# Patient Record
Sex: Male | Born: 1990 | Race: Black or African American | Hispanic: No | Marital: Single | Smoking: Never smoker
Health system: Southern US, Community
[De-identification: ages and names within clinical notes are randomized; demographics above are authoritative.]

## PROBLEM LIST (undated history)

## (undated) DIAGNOSIS — R569 Unspecified convulsions: Secondary | ICD-10-CM

## (undated) DIAGNOSIS — J45909 Unspecified asthma, uncomplicated: Secondary | ICD-10-CM

---

## 2022-01-03 ENCOUNTER — Emergency Department (HOSPITAL_COMMUNITY): Payer: Self-pay

## 2022-01-03 ENCOUNTER — Other Ambulatory Visit: Payer: Self-pay

## 2022-01-03 ENCOUNTER — Encounter (HOSPITAL_COMMUNITY): Payer: Self-pay

## 2022-01-03 ENCOUNTER — Emergency Department (HOSPITAL_COMMUNITY)
Admission: EM | Admit: 2022-01-03 | Discharge: 2022-01-03 | Disposition: A | Discharge status: Home / Self Care | Payer: Self-pay | Attending: Emergency Medicine | Admitting: Emergency Medicine

## 2022-01-03 DIAGNOSIS — E876 Hypokalemia: Secondary | ICD-10-CM | POA: Insufficient documentation

## 2022-01-03 DIAGNOSIS — R569 Unspecified convulsions: Secondary | ICD-10-CM | POA: Insufficient documentation

## 2022-01-03 DIAGNOSIS — R519 Headache, unspecified: Secondary | ICD-10-CM | POA: Insufficient documentation

## 2022-01-03 HISTORY — DX: Unspecified convulsions: R56.9

## 2022-01-03 HISTORY — DX: Unspecified asthma, uncomplicated: J45.909

## 2022-01-03 LAB — BASIC METABOLIC PANEL
Anion gap: 7 (ref 5–15)
BUN: 7 mg/dL (ref 6–20)
CO2: 21 mmol/L — ABNORMAL LOW (ref 22–32)
Calcium: 8.5 mg/dL — ABNORMAL LOW (ref 8.9–10.3)
Chloride: 108 mmol/L (ref 98–111)
Creatinine, Ser: 0.8 mg/dL (ref 0.61–1.24)
GFR, Estimated: >60 mL/min (ref >60)
Glucose, Bld: 85 mg/dL (ref 70–99)
Potassium: 3.3 mmol/L — ABNORMAL LOW (ref 3.5–5.1)
Sodium: 136 mmol/L (ref 135–145)

## 2022-01-03 LAB — CBC
HCT: 46.7 % (ref 39.0–52.0)
Hemoglobin: 16.4 g/dL (ref 13.0–17.0)
MCH: 31.2 pg (ref 26.0–34.0)
MCHC: 35.1 g/dL (ref 30.0–36.0)
MCV: 88.8 fL (ref 80.0–100.0)
Platelets: 268 10*3/uL (ref 150–400)
RBC: 5.26 MIL/uL (ref 4.22–5.81)
RDW: 12.6 % (ref 11.5–15.5)
WBC: 6.9 10*3/uL (ref 4.0–10.5)
nRBC: 0 % (ref 0.0–0.2)

## 2022-01-03 LAB — RAPID URINE DRUG SCREEN, HOSP PERFORMED
Amphetamines: POSITIVE — AB
Barbiturates: NOT DETECTED
Benzodiazepines: POSITIVE — AB
Cocaine: NOT DETECTED
Opiates: NOT DETECTED
Tetrahydrocannabinol: POSITIVE — AB

## 2022-01-03 LAB — ETHANOL: Alcohol, Ethyl (B): <10 mg/dL (ref <10)

## 2022-01-03 MED ORDER — LEVETIRACETAM 500 MG PO TABS: 500.0000 mg | ORAL_TABLET | Freq: Two times a day (BID) | ORAL | 0 refills | Status: AC

## 2022-01-03 MED ORDER — LEVETIRACETAM IN NACL 1500 MG/100ML IV SOLN
1500.0000 mg | Freq: Once | INTRAVENOUS | Status: AC
  Administered 2022-01-03: 1500 mg via INTRAVENOUS
  Filled 2022-01-03: qty 100

## 2022-01-03 NOTE — ED Triage Notes (Signed)
Pt BIB EMS from work for a seizure. Per EMS pt co-worker described the seizure lasting 1 minute (Atonic clonic). Per EMS pt  was "post-ictal" upon arrival. Pt has had hx of seizures. Febrile seizure when he was a kid and this is his third seizure within the last 6 months. No treatments/assessments has been given and he is not on any medications for seizure activity. Pt presents w/ bruising on the left side of his head and left shoulder. ? ?121/54 bp ?116 hr ?16 rr ?98% room air ?79 cbg ? ?20g left forearm ?

## 2022-01-03 NOTE — ED Provider Notes (Signed)
?[facility]-EMERGENCY DEPT ?Provider Note ? ? ?CSN: [id number] ?Arrival date & time: 01/03/22  1143 ? ?  ? ?History ? ?Chief Complaint  ?Patient presents with  ? Seizures  ? ? ?Mitchell Wolf is a 31 y.o. male. ? ?HPI ?31 year old male history of seizure disorder as a child presents today with episode of altered mental status.  This was unwitnessed.  He was found on the ground.  He was confused afterwards.  He states he has had 2 prior episodes of this in the past 6 months.  1 episode was witnessed with a question of some shaking.  The other episode he was found down and was confused afterwards.  He states he hurt his head and 1 of these episodes.  He went to the hospital was not evaluated due to a long wait.  States he has not been evaluated in the past 6 months.  He denies any lateralized symptoms, recent head injuries besides the fall, changes in dietary or other intake.  He not regularly drink alcohol.  He does smoke marijuana.  He endorses regular vaping but denies any other illicit drug use.  He has some headache today.  He denies loss of bowel or bladder control during these episodes ? ?  ? ?Home Medications ?Prior to Admission medications   ?Medication Sig Start Date End Date Taking? Authorizing Provider  ?levETIRAcetam (KEPPRA) 500 MG tablet Take 1 tablet (500 mg total) by mouth 2 (two) times daily. 01/03/22  Yes Margarita Grizzle, MD  ?   ? ?Allergies    ?Patient has no allergy information on record.   ? ?Review of Systems   ?Review of Systems  ?All other systems reviewed and are negative. ? ?Physical Exam ?Updated Vital Signs ?BP (!) 132/91   Pulse 94   Temp 99.3 ?F (37.4 ?C) (Oral)   Resp 18   Ht 1.854 m (6\' 1" )   Wt 104.3 kg   SpO2 98%   BMI 30.34 kg/m?  ?Physical Exam ?Vitals and nursing note reviewed.  ?Constitutional:   ?   Appearance: Normal appearance.  ?HENT:  ?   Head: Normocephalic.  ?   Right Ear: External ear normal.  ?   Left Ear: External ear normal.  ?   Nose: Nose normal.   ?   Mouth/Throat:  ?   Pharynx: Oropharynx is clear.  ?Eyes:  ?   Pupils: Pupils are equal, round, and reactive to light.  ?Cardiovascular:  ?   Rate and Rhythm: Normal rate and regular rhythm.  ?   Pulses: Normal pulses.  ?Pulmonary:  ?   Effort: Pulmonary effort is normal.  ?   Breath sounds: Normal breath sounds.  ?Abdominal:  ?   General: Abdomen is flat. Bowel sounds are normal.  ?   Palpations: Abdomen is soft.  ?Musculoskeletal:     ?   General: Normal range of motion.  ?   Cervical back: Normal range of motion.  ?Skin: ?   General: Skin is warm.  ?   Capillary Refill: Capillary refill takes less than 2 seconds.  ?Neurological:  ?   Mental Status: He is alert and oriented to person, place, and time.  ?   Cranial Nerves: No cranial nerve deficit.  ?   Sensory: No sensory deficit.  ?   Coordination: Coordination normal.  ?   Deep Tendon Reflexes: Reflexes normal.  ?Psychiatric:     ?   Mood and Affect: Mood normal.  ? ? ?ED Results /  Procedures / Treatments   ?Labs ?(all labs ordered are listed, but only abnormal results are displayed) ?Labs Reviewed  ?BASIC METABOLIC PANEL - Abnormal; Notable for the following components:  ?    Result Value  ? Potassium 3.3 (*)   ? CO2 21 (*)   ? Calcium 8.5 (*)   ? All other components within normal limits  ?RAPID URINE DRUG SCREEN, HOSP PERFORMED - Abnormal; Notable for the following components:  ? Benzodiazepines POSITIVE (*)   ? Amphetamines POSITIVE (*)   ? Tetrahydrocannabinol POSITIVE (*)   ? All other components within normal limits  ?CBC  ?ETHANOL  ? ? ?EKG ?None ? ?Radiology ?CT Head Wo Contrast ? ?Result Date: 01/03/2022 ?CLINICAL DATA:  Seizure. EXAM: CT HEAD WITHOUT CONTRAST TECHNIQUE: Contiguous axial images were obtained from the base of the skull through the vertex without intravenous contrast. RADIATION DOSE REDUCTION: This exam was performed according to the departmental dose-optimization program which includes automated exposure control, adjustment of the mA  and/or kV according to patient size and/or use of iterative reconstruction technique. COMPARISON:  None. FINDINGS: Brain: No evidence of acute infarction, hemorrhage, hydrocephalus, extra-axial collection or mass lesion/mass effect. Vascular: No hyperdense vessel or unexpected calcification. Skull: Normal. Negative for fracture or focal lesion. Sinuses/Orbits: No acute finding. Other: None. IMPRESSION: 1. Normal brain Electronically Signed   By: Signa Kellaylor  Stroud M.D.   On: 01/03/2022 12:31   ? ?Procedures ?Procedures  ? ? ?Medications Ordered in ED ?Medications  ?levETIRAcetam (KEPPRA) IVPB 1500 mg/ 100 mL premix (0 mg Intravenous Stopped 01/03/22 1302)  ? ? ?ED Course/ Medical Decision Making/ A&P ?Clinical Course as of 01/04/22 1719  ?Tue Jan 03, 2022  ?1317 Complete metabolic panel reviewed interpreted with mild hypokalemia and CO2 decreased to 21 consistent with seizure ?We will orally replete potassium [DR]  ?1317 CT of head reviewed interpreted and no acute abnormalities noted radiologist interpretation notes normal brain [DR]  ?1421 Urine drug screen positive for amphetamines, THC, and benzodiazepines.   [DR]  ?  ?Clinical Course User Index ?[DR] Margarita Grizzleay, Letti Towell, MD  ? ?                        ?Medical Decision Making ?31 year old male presents today with report of altered mental status and appears to have had seizure with postictal episode.  He reports 2 prior episodes in the last 6 months for total of 3 episodes.  1 of these was witnessed with tonic-clonic activity.  Each episode had a postictal component.  He is currently back to baseline.  Here is hemodynamically stable.  Urine is positive for THC, amphetamines, and benzos.  He was mildly hypokalemic and this is repleted.  Otherwise labs are normal. ?Patient given Keppra in ED ?Patient Discussed seizure precautions.  He does not drive. ?He is given referral to neurology and prescription for Keppra.  He appears stable for discharge ? ?Amount and/or Complexity  of Data Reviewed ?Independent Historian: EMS ?   Details: EMS note reviewed ?Labs: ordered. Decision-making details documented in ED Course. ?Radiology: ordered and independent interpretation performed. Decision-making details documented in ED Course. ? ?Risk ?Prescription drug management. ? ? ? ? ? ? ? ? ? ? ?Final Clinical Impression(s) / ED Diagnoses ?Final diagnoses:  ?Seizure (HCC)  ? ? ?Rx / DC Orders ?ED Discharge Orders   ? ?      Ordered  ?  levETIRAcetam (KEPPRA) 500 MG tablet  2 times daily       ?  01/03/22 1426  ?  Ambulatory referral to Neurology       ?Comments: An appointment is requested in approximately: 1 week  ? 01/03/22 1426  ? ?  ?  ? ?  ? ? ?  ?Margarita Grizzle, MD ?01/04/22 1719 ? ?

## 2022-01-03 NOTE — ED Notes (Signed)
Pt states understanding of dc instructions, importance of follow up. Pt denies questions or concerns and declined transportation assistance upon dc. Pt ambulated w/ a steady gait w/o need for assistance. No belongings left in room upon dc. ? ?

## 2022-01-03 NOTE — Discharge Instructions (Signed)
Per [facility] statutes, patients with seizures are not allowed to drive until they have been seizure-free for six months. Use caution when using heavy equipment or power tools. Avoid working on ladders or at heights. Take showers instead of baths. Ensure the water temperature is not too high on the home water heater. Do not go swimming alone. Do not lock yourself in a room alone (i.e. bathroom). When caring for infants or small children, sit down when holding, feeding, or changing them to minimize risk of injury to the child in the event you have a seizure. Maintain good sleep hygiene. Avoid alcohol.  ? ? ?If Mitchell Wolf has another seizure, call 911 and bring them back to the ED if: ?      A.  The seizure lasts longer than 5 minutes.      ?      B.  The patient doesn't wake shortly after the seizure or has new problems such as difficulty seeing, speaking or moving following the seizure ?      C.  The patient was injured during the seizure ?      D.  The patient has a temperature over 102 F (39C) ?      E.  The patient vomited during the seizure and now is having trouble breathing ? ?

## 2022-01-17 ENCOUNTER — Encounter: Payer: Self-pay | Admitting: Neurology

## 2022-01-17 ENCOUNTER — Ambulatory Visit: Payer: Self-pay | Admitting: Neurology

## 2022-02-06 DEATH — deceased

## 2023-04-22 IMAGING — CT CT HEAD W/O CM
3 series · 15 of 47 positions shown, 18 images · non-contrast
Comparison: None.

CLINICAL DATA: Seizure.



[Series 2: head wo · axial · 0.46mm/px · z∈[-78,+62]mm · 9 of 34 slices shown, 12 images]
[im 3/34  brain]
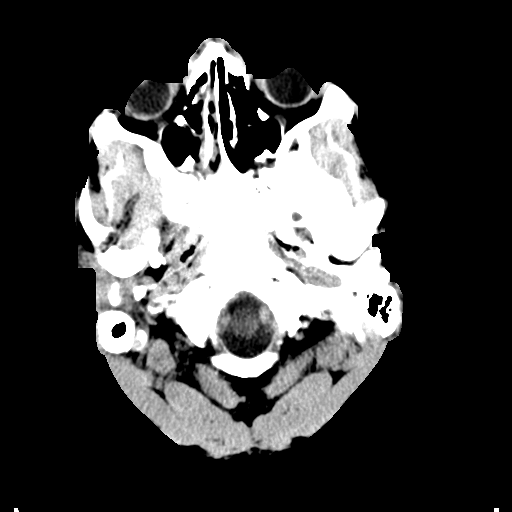
[im 3/34  bone]
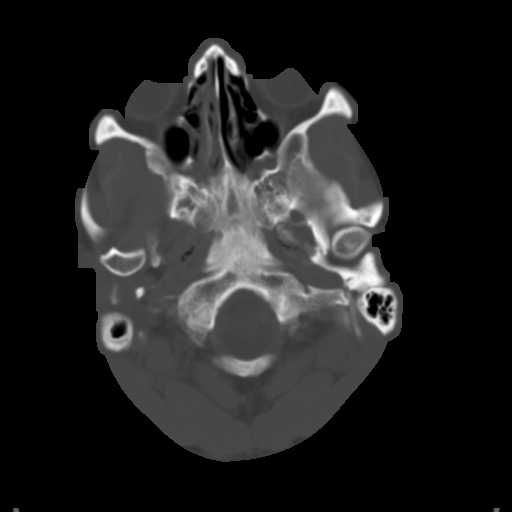
[im 6/34  brain]
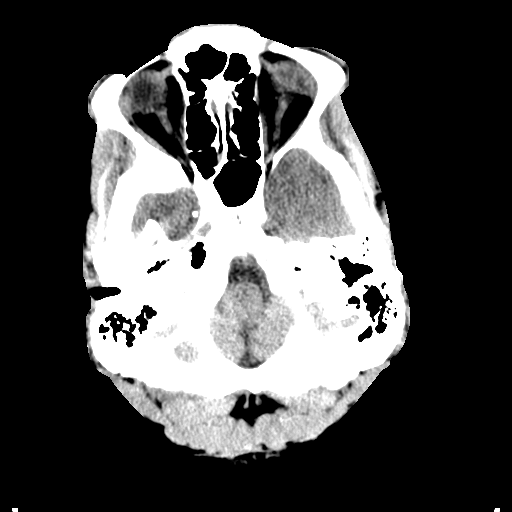
[im 10/34  brain]
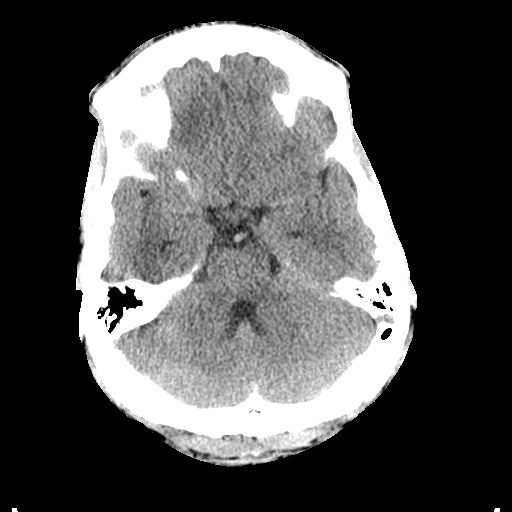
[im 13/34  brain]
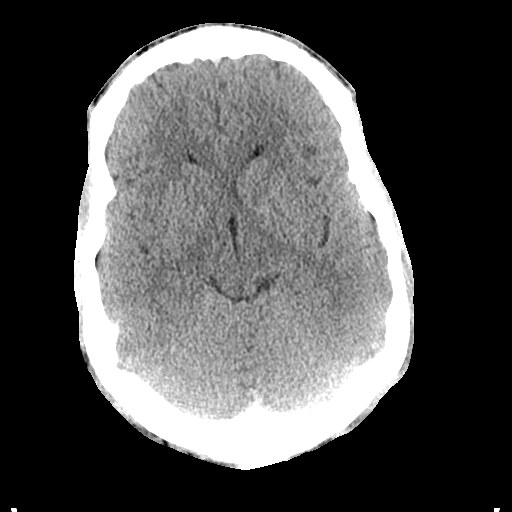
[im 18/34  brain]
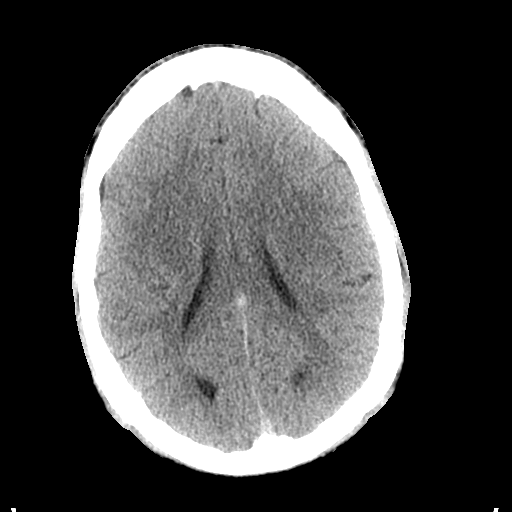
[im 18/34  bone]
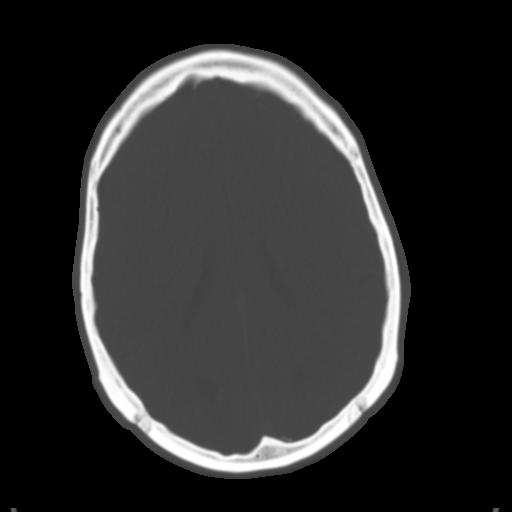
[im 21/34  brain]
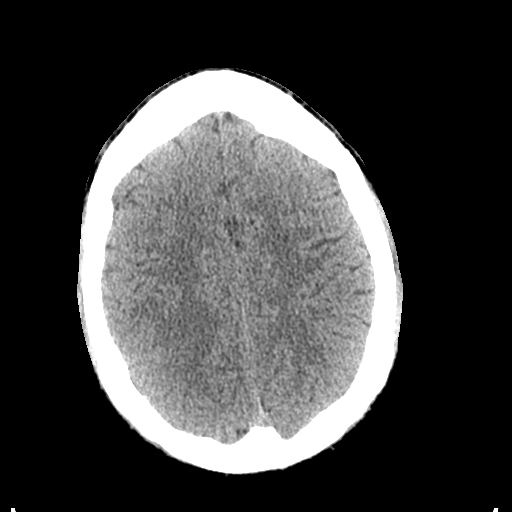
[im 24/34  brain]
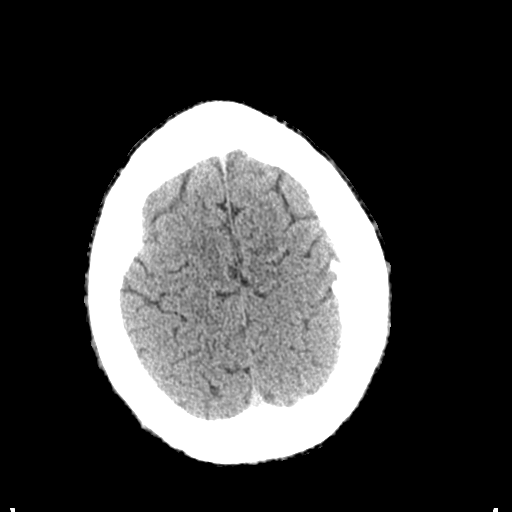
[im 28/34  brain]
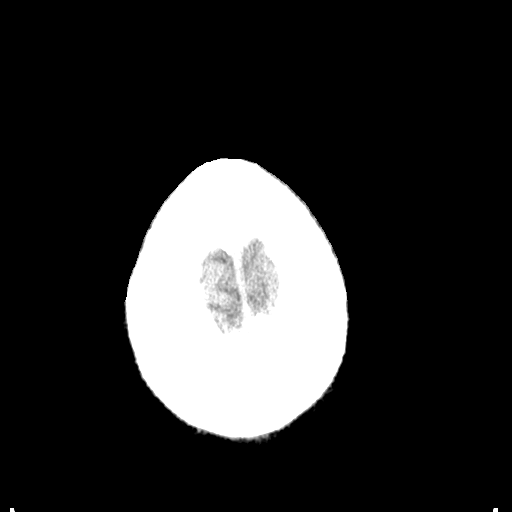
[im 31/34  brain]
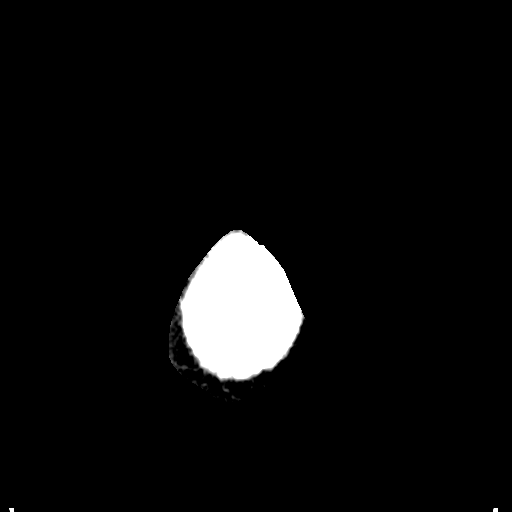
[im 31/34  bone]
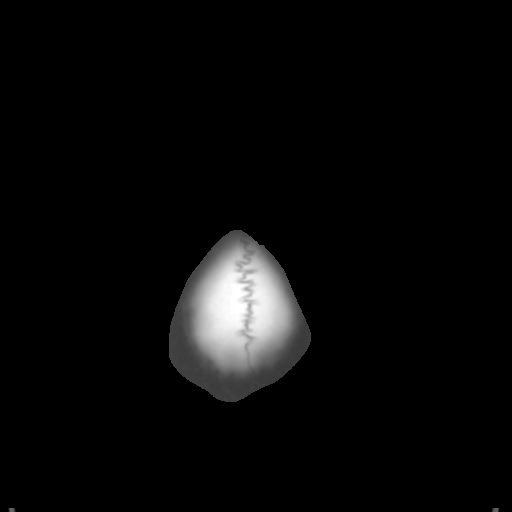

[Series 5: coronal soft tissue · coronal · 0.31mm/px · 3 of 75 slices shown]
[im 25/75  brain]
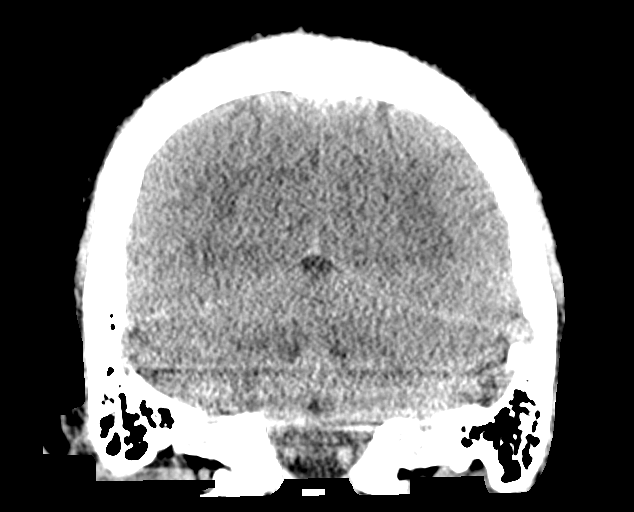
[im 33/75  brain]
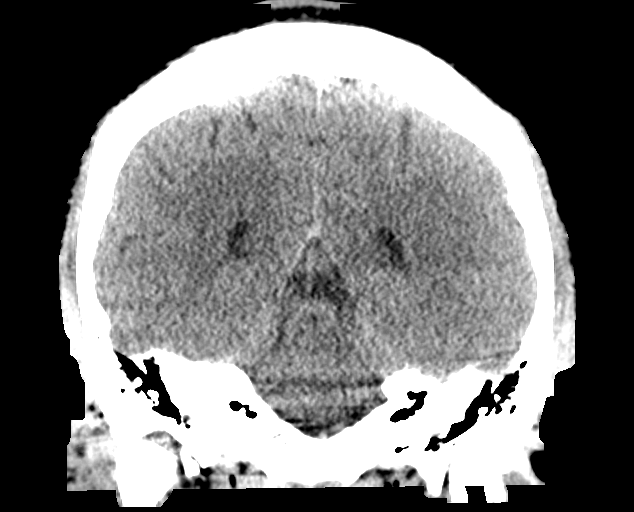
[im 42/75  brain]
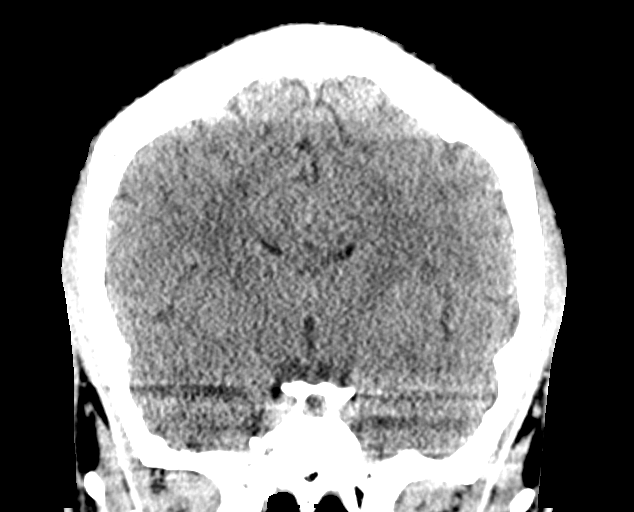

[Series 6: sagittal soft tissue · sagittal · 0.31mm/px · 3 of 67 slices shown]
[im 23/67  brain]
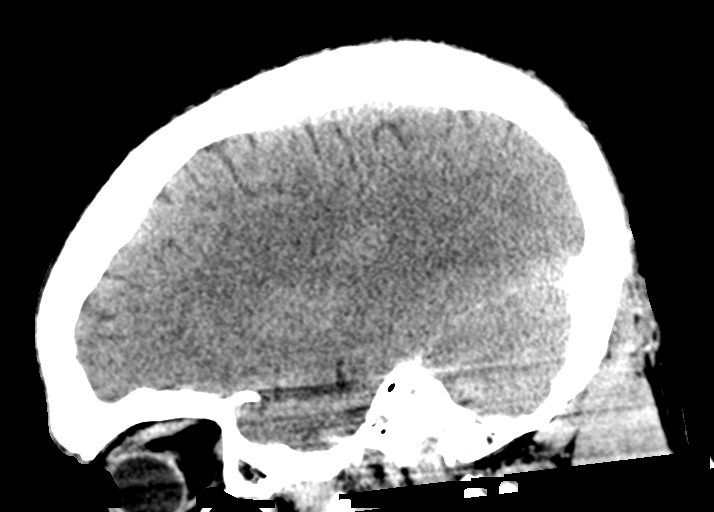
[im 34/67  brain]
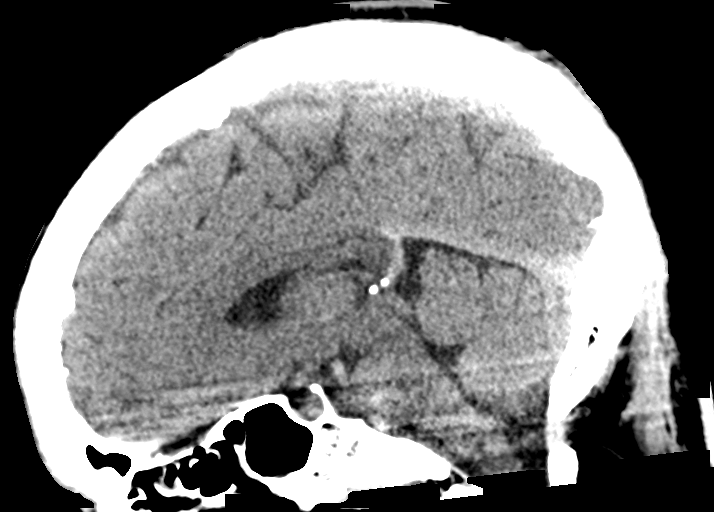
[im 45/67  brain]
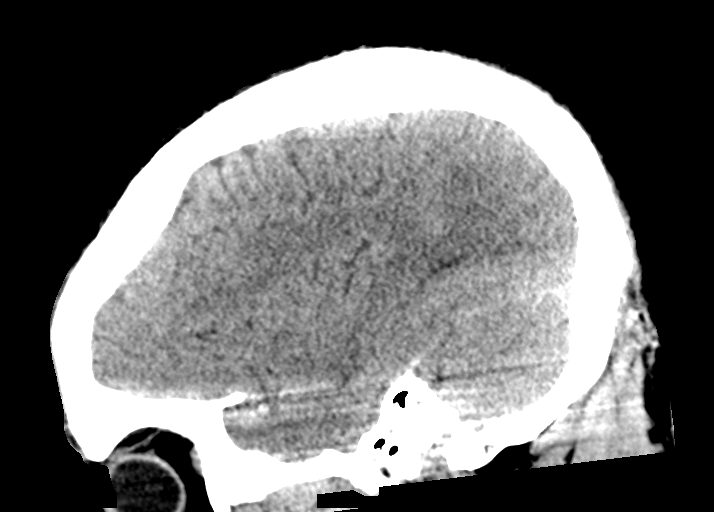

[15 of 47 positions shown; findings below may reference images not displayed]

FINDINGS: Brain: No evidence of acute infarction, hemorrhage, hydrocephalus,
extra-axial collection or mass lesion/mass effect.

Vascular: No hyperdense vessel or unexpected calcification.

Skull: Normal. Negative for fracture or focal lesion.

Sinuses/Orbits: No acute finding.

Other: None.
IMPRESSION: 1. Normal brain
